# Patient Record
Sex: Female | Born: 1960 | Race: White | Hispanic: No | Smoking: Current every day smoker
Health system: Southern US, Community
[De-identification: ages and names within clinical notes are randomized; demographics above are authoritative.]

## PROBLEM LIST (undated history)

## (undated) DIAGNOSIS — M1990 Unspecified osteoarthritis, unspecified site: Secondary | ICD-10-CM

## (undated) DIAGNOSIS — I10 Essential (primary) hypertension: Secondary | ICD-10-CM

## (undated) DIAGNOSIS — J439 Emphysema, unspecified: Secondary | ICD-10-CM

## (undated) DIAGNOSIS — K219 Gastro-esophageal reflux disease without esophagitis: Secondary | ICD-10-CM

## (undated) HISTORY — PX: OTHER SURGICAL HISTORY: SHX169

## (undated) HISTORY — DX: Essential (primary) hypertension: I10

## (undated) HISTORY — PX: TONSILLECTOMY: SUR1361

## (undated) HISTORY — DX: Gastro-esophageal reflux disease without esophagitis: K21.9

---

## 2017-01-13 ENCOUNTER — Emergency Department (HOSPITAL_BASED_OUTPATIENT_CLINIC_OR_DEPARTMENT_OTHER): Admit: 2017-01-13 | Discharge: 2017-01-13 | Disposition: A | Discharge status: Home / Self Care | Payer: Self-pay

## 2017-01-13 ENCOUNTER — Emergency Department (HOSPITAL_COMMUNITY)
Admission: EM | Admit: 2017-01-13 | Discharge: 2017-01-13 | Disposition: A | Discharge status: Home / Self Care | Payer: Self-pay | Attending: Emergency Medicine | Admitting: Emergency Medicine

## 2017-01-13 ENCOUNTER — Encounter: Payer: Self-pay | Admitting: Pediatric Intensive Care

## 2017-01-13 ENCOUNTER — Encounter (HOSPITAL_COMMUNITY): Payer: Self-pay | Admitting: Emergency Medicine

## 2017-01-13 DIAGNOSIS — M79609 Pain in unspecified limb: Secondary | ICD-10-CM

## 2017-01-13 DIAGNOSIS — L03115 Cellulitis of right lower limb: Secondary | ICD-10-CM | POA: Insufficient documentation

## 2017-01-13 DIAGNOSIS — F17210 Nicotine dependence, cigarettes, uncomplicated: Secondary | ICD-10-CM | POA: Insufficient documentation

## 2017-01-13 DIAGNOSIS — R609 Edema, unspecified: Secondary | ICD-10-CM | POA: Insufficient documentation

## 2017-01-13 HISTORY — DX: Emphysema, unspecified: J43.9

## 2017-01-13 HISTORY — DX: Unspecified osteoarthritis, unspecified site: M19.90

## 2017-01-13 LAB — BASIC METABOLIC PANEL
Anion gap: 7 (ref 5–15)
BUN: 13 mg/dL (ref 6–20)
CHLORIDE: 107 mmol/L (ref 101–111)
CO2: 25 mmol/L (ref 22–32)
Calcium: 8.7 mg/dL — ABNORMAL LOW (ref 8.9–10.3)
Creatinine, Ser: 0.63 mg/dL (ref 0.44–1.00)
GFR calc Af Amer: >60 mL/min (ref >60)
GLUCOSE: 96 mg/dL (ref 65–99)
Potassium: 3.8 mmol/L (ref 3.5–5.1)
SODIUM: 139 mmol/L (ref 135–145)

## 2017-01-13 LAB — CBC WITH DIFFERENTIAL/PLATELET
Basophils Absolute: 0 10*3/uL (ref 0.0–0.1)
Basophils Relative: 0 %
EOS ABS: 0.2 10*3/uL (ref 0.0–0.7)
EOS PCT: 2 %
HCT: 35.2 % — ABNORMAL LOW (ref 36.0–46.0)
Hemoglobin: 11.9 g/dL — ABNORMAL LOW (ref 12.0–15.0)
LYMPHS ABS: 2.4 10*3/uL (ref 0.7–4.0)
Lymphocytes Relative: 26 %
MCH: 29.9 pg (ref 26.0–34.0)
MCHC: 33.8 g/dL (ref 30.0–36.0)
MCV: 88.4 fL (ref 78.0–100.0)
MONOS PCT: 9 %
Monocytes Absolute: 0.8 10*3/uL (ref 0.1–1.0)
NEUTROS ABS: 5.7 10*3/uL (ref 1.7–7.7)
NEUTROS PCT: 63 %
PLATELETS: 317 10*3/uL (ref 150–400)
RBC: 3.98 MIL/uL (ref 3.87–5.11)
RDW: 12.6 % (ref 11.5–15.5)
WBC: 9.2 10*3/uL (ref 4.0–10.5)

## 2017-01-13 LAB — I-STAT CG4 LACTIC ACID, ED: LACTIC ACID, VENOUS: 0.39 mmol/L — AB (ref 0.5–1.9)

## 2017-01-13 MED ORDER — SULFAMETHOXAZOLE-TRIMETHOPRIM 800-160 MG PO TABS
1.0000 | ORAL_TABLET | Freq: Once | ORAL | Status: AC
  Administered 2017-01-13: 1 via ORAL
  Filled 2017-01-13: qty 1

## 2017-01-13 MED ORDER — IBUPROFEN 600 MG PO TABS
600.0000 mg | ORAL_TABLET | Freq: Four times a day (QID) | ORAL | 0 refills | Status: AC | PRN
Start: 2017-01-13 — End: ?

## 2017-01-13 MED ORDER — CEPHALEXIN 500 MG PO CAPS: 500.0000 mg | ORAL_CAPSULE | Freq: Three times a day (TID) | ORAL | 0 refills | Status: AC

## 2017-01-13 MED ORDER — IBUPROFEN 800 MG PO TABS
800.0000 mg | ORAL_TABLET | Freq: Once | ORAL | Status: AC
  Administered 2017-01-13: 800 mg via ORAL
  Filled 2017-01-13: qty 1

## 2017-01-13 MED ORDER — CEPHALEXIN 500 MG PO CAPS
500.0000 mg | ORAL_CAPSULE | Freq: Once | ORAL | Status: AC
  Administered 2017-01-13: 500 mg via ORAL
  Filled 2017-01-13: qty 1

## 2017-01-13 MED ORDER — SULFAMETHOXAZOLE-TRIMETHOPRIM 800-160 MG PO TABS: 1.0000 | ORAL_TABLET | Freq: Two times a day (BID) | ORAL | 0 refills | Status: AC

## 2017-01-13 NOTE — ED Notes (Signed)
ED Provider at bedside. 

## 2017-01-13 NOTE — ED Notes (Signed)
Bed: WA01 Expected date:  Expected time:  Means of arrival:  Comments: 

## 2017-01-13 NOTE — Discharge Instructions (Signed)
Take both keflex and bactrim as prescribed until all gone. Ibuprofen for pain. ELEVATE your legs at rest and at night time. Follow up with family doctor. return if worsening.

## 2017-01-13 NOTE — Progress Notes (Signed)
*  PRELIMINARY RESULTS* Vascular Ultrasound Right lower extremity venous duplex has been completed.  Preliminary findings: No evidence of deep vein thrombosis or baker's cyst in the right lower extremity.  Preliminary results given to patients PA at 14:55   Chauncey FischerCharlotte C Annalisse Minkoff 01/13/2017, 2:59 PM

## 2017-01-13 NOTE — Congregational Nurse Program (Signed)
Congregational Nurse Program Note  Date of Encounter: 01/13/2017  Past Medical History: No past medical history on file.  Encounter Details:     CNP Questionnaire - 01/13/17 1000      Patient Demographics   Is this a new or existing patient? New   Patient is considered a/an Not Applicable   Race Caucasian/White     Patient Assistance   Location of Patient Assistance [facility]   Patient's financial/insurance status Self-Pay (Uninsured);Low Income   Uninsured Patient ([employer], trade [employer]Card/Care Connects) Yes   Interventions Not Applicable;Referred to ED/Urgent Care   Patient referred to apply for the following financial assistance Not Applicable   Food insecurities addressed Not Applicable   Transportation assistance Yes   Type of Assistance Other   Assistance securing medications No   Educational health offerings Acute disease     Encounter Details   Primary purpose of visit Acute Illness/Condition Visit   Was an Emergency Department visit averted? No   Does patient have a medical provider? No   Patient referred to Emergency Department   Was a mental health screening completed? (GAINS tool) No   Does patient have dental issues? No   Does patient have vision issues? No   Does your patient have an abnormal blood pressure today? No   Since previous encounter, have you referred patient for abnormal blood pressure that resulted in a new diagnosis or medication change? No   Does your patient have an abnormal blood glucose today? No   Since previous encounter, have you referred patient for abnormal blood glucose that resulted in a new diagnosis or medication change? No   Was there a life-saving intervention made? No    New client- lobby guest. States that she has red painful swollen right leg. "I may have been bit by something". States that swelling started 3 days ago. She states she has been having "cold sweats". Top of right foot, ankle and just above ankle are edematous, with erythema and  painful to touch. Left ankle WNL. Client agrees to go to [facility] for evaluation.

## 2017-01-13 NOTE — ED Provider Notes (Signed)
[facility]-EMERGENCY DEPT Provider Note   CSN: [id number] Arrival date & time: 01/13/17  1038     History   Chief Complaint Chief Complaint  Patient presents with  . Leg Swelling  . Cellulitis    HPI Geraldine SolarMarcella Cruces is a 56 y.o. female.  HPI Geraldine SolarMarcella Stucke is a 56 y.o. female with history of emphysema, presents to emergency department complaining of bilateral leg swelling with of right leg redness and warmth. Patient states she just got out of jail one week ago, states she has been staying at a shelter and sleeping in a chair since being released from jail. She states she noticed bilateral lower leg swelling since being released, and the last 2 days noticed that her right leg is becoming red, tender, warm to the touch. She denies any injuries. No recent travel or surgeries. She states she has been in the woods might have come in contact with some poison ivy. She denies any fever but reports chills. No history of blood clots in the past. No other complaints.   Past Medical History:  Diagnosis Date  . Arthritis   . Emphysema lung (HCC)     There are no active problems to display for this patient.   Past Surgical History:  Procedure Laterality Date  . cyst removed Right   . rigth fallopian tube removed    . TONSILLECTOMY      OB History    No data available       Home Medications    Prior to Admission medications   Not on File    Family History No family history on file.  Social History Social History  Substance Use Topics  . Smoking status: Current Every Day Smoker    Types: Cigarettes  . Smokeless tobacco: Never Used  . Alcohol use No     Allergies   Penicillins and Levaquin [levofloxacin in d5w]   Review of Systems Review of Systems  Constitutional: Negative for chills and fever.  Respiratory: Negative for cough, chest tightness and shortness of breath.   Cardiovascular: Positive for leg swelling. Negative for chest pain and palpitations.    Gastrointestinal: Negative for abdominal pain, diarrhea, nausea and vomiting.  Musculoskeletal: Positive for arthralgias and joint swelling. Negative for myalgias, neck pain and neck stiffness.  Skin: Negative for rash.  Neurological: Negative for dizziness, weakness and headaches.  All other systems reviewed and are negative.    Physical Exam Updated Vital Signs BP (!) 120/57   Pulse 74   Temp 98.4 F (36.9 C) (Oral)   Resp 18   Ht 5\' 2"  (1.575 m)   Wt 76.2 kg (168 lb)   SpO2 97%   BMI 30.73 kg/m   Physical Exam  Constitutional: She appears well-developed and well-nourished. No distress.  HENT:  Head: Normocephalic.  Eyes: Conjunctivae are normal.  Neck: Neck supple.  Cardiovascular: Normal rate, regular rhythm and normal heart sounds.   Pulmonary/Chest: Effort normal and breath sounds normal. No respiratory distress. She has no wheezes. She has no rales.  Abdominal: Soft. Bowel sounds are normal. She exhibits no distension. There is no tenderness. There is no rebound.  Musculoskeletal: She exhibits edema.  1+ bilateral lower extremity edema. Right leg is erythematous, with erythema mainly extending to the medial ankle and foot, and medial and anterior distal shin. No skin wounds. Dorsal pedal pulses intact and equal bilaterally. Right leg is warm and painful to the touch.  Neurological: She is alert.  Skin: Skin is warm and  dry.  Psychiatric: She has a normal mood and affect. Her behavior is normal.  Nursing note and vitals reviewed.    ED Treatments / Results  Labs (all labs ordered are listed, but only abnormal results are displayed) Labs Reviewed  CBC WITH DIFFERENTIAL/PLATELET  BASIC METABOLIC PANEL  I-STAT CG4 LACTIC ACID, ED    EKG  EKG Interpretation None       Radiology No results found.  Procedures Procedures (including critical care time)  Medications Ordered in ED Medications - No data to display   Initial Impression / Assessment and  Plan / ED Course  I have reviewed the triage vital signs and the nursing notes.  Pertinent labs & imaging results that were available during my care of the patient were reviewed by me and considered in my medical decision making (see chart for details).     Patient with bilateral lower extremity swelling, with right leg redness and warmth to the touch as well as tenderness. Will get a venous duplex, to rule out DVT. Will get basic labs.  3:48 PM Venous Doppler is negative. Labs unremarkable. Normal lactic acid, normal white blood cell count. She is afebrile. Most likely dependent edema, cellulitis of the right lower leg. Will start on Keflex and Bactrim. Follow-up with family doctor as needed. Return precautions discussed. Advised to keep legs elevated as much as possible to reduce the swelling. Compression stockings recommended as well.  Vitals:   01/13/17 1054 01/13/17 1410  BP: (!) 133/57 (!) 120/57  Pulse: 76 74  Resp: 18 18  Temp: 98.4 F (36.9 C)   TempSrc: Oral   SpO2: 98% 97%  Weight: 76.2 kg (168 lb)   Height: 5\' 2"  (1.575 m)       Final Clinical Impressions(s) / ED Diagnoses   Final diagnoses:  Cellulitis of right leg  Dependent edema    New Prescriptions New Prescriptions   CEPHALEXIN (KEFLEX) 500 MG CAPSULE    Take 1 capsule (500 mg total) by mouth 3 (three) times daily.   IBUPROFEN (ADVIL,MOTRIN) 600 MG TABLET    Take 1 tablet (600 mg total) by mouth every 6 (six) hours as needed.   SULFAMETHOXAZOLE-TRIMETHOPRIM (BACTRIM DS,SEPTRA DS) 800-160 MG TABLET    Take 1 tablet by mouth 2 (two) times daily.     Jaynie CrumbleKirichenko, Pierre Cumpton, PA-C 01/13/17 1550    Pricilla LovelessGoldston, Scott, MD 01/13/17 1620

## 2017-01-13 NOTE — ED Triage Notes (Signed)
Patient c/o erythema and swelling of right lower leg and foot x 3 days.  Patient reports that she doesn't take narcotics.

## 2017-01-14 MED FILL — CEPHALEXIN 500 MG CAPSULE: 500 | 7 days supply | Qty: 21 | Fill #0

## 2017-01-14 MED FILL — IBUPROFEN 600 MG TABLET: 600 | 7 days supply | Qty: 30 | Fill #0

## 2017-01-14 MED FILL — SULFAMETHOXAZOLE/TMP DS TAB: 800-160 | 7 days supply | Qty: 14 | Fill #0

## 2017-01-16 ENCOUNTER — Other Ambulatory Visit: Payer: Self-pay | Admitting: Critical Care Medicine

## 2017-01-16 MED ORDER — MOMETASONE FUROATE 220 MCG/INH IN AEPB: 2.0000 | INHALATION_SPRAY | Freq: Every day | RESPIRATORY_TRACT | 1 refills | Status: DC

## 2017-01-16 MED ORDER — RANITIDINE HCL 150 MG PO TABS: 150.0000 mg | ORAL_TABLET | Freq: Two times a day (BID) | ORAL | 1 refills | Status: AC

## 2017-01-16 MED ORDER — ALBUTEROL SULFATE HFA 108 (90 BASE) MCG/ACT IN AERS: 2.0000 | INHALATION_SPRAY | Freq: Four times a day (QID) | RESPIRATORY_TRACT | 1 refills | Status: DC

## 2017-01-16 MED FILL — VENTOLIN HFA 90 MCG INHALER: 108 (90 BAS | 25 days supply | Qty: 18 | Fill #0

## 2017-01-16 MED FILL — ASMANEX TWISTHALER 220 MCG: 220 | 30 days supply | Qty: 2 | Fill #0

## 2017-01-16 MED FILL — raNITIdine HCL 150 MG TABS: 150 | 30 days supply | Qty: 60 | Fill #0 | Status: TO

## 2017-01-16 NOTE — Progress Notes (Signed)
Refills for albuterol, asmanex and ranitidine.  Will be seen in clinic next week Kristy LevansPatrick Kiven Vangilder

## 2017-01-17 ENCOUNTER — Encounter: Payer: Self-pay | Admitting: Pediatric Intensive Care

## 2017-01-20 ENCOUNTER — Encounter: Payer: Self-pay | Admitting: Pediatric Intensive Care

## 2017-01-22 ENCOUNTER — Ambulatory Visit: Payer: Self-pay | Admitting: Critical Care Medicine

## 2017-02-03 ENCOUNTER — Encounter: Payer: Self-pay | Admitting: Pediatric Intensive Care

## 2017-02-05 ENCOUNTER — Other Ambulatory Visit: Payer: Self-pay

## 2017-02-05 ENCOUNTER — Ambulatory Visit: Payer: Self-pay | Attending: Critical Care Medicine | Admitting: Critical Care Medicine

## 2017-02-05 ENCOUNTER — Encounter: Payer: Self-pay | Admitting: Critical Care Medicine

## 2017-02-05 VITALS — BP 145/81 | HR 82 | Temp 98.1°F | Resp 16 | Wt 168.8 lb

## 2017-02-05 DIAGNOSIS — I10 Essential (primary) hypertension: Secondary | ICD-10-CM | POA: Insufficient documentation

## 2017-02-05 DIAGNOSIS — J441 Chronic obstructive pulmonary disease with (acute) exacerbation: Secondary | ICD-10-CM | POA: Insufficient documentation

## 2017-02-05 DIAGNOSIS — E042 Nontoxic multinodular goiter: Secondary | ICD-10-CM | POA: Insufficient documentation

## 2017-02-05 DIAGNOSIS — R0982 Postnasal drip: Secondary | ICD-10-CM | POA: Insufficient documentation

## 2017-02-05 DIAGNOSIS — K219 Gastro-esophageal reflux disease without esophagitis: Secondary | ICD-10-CM | POA: Insufficient documentation

## 2017-02-05 DIAGNOSIS — F17210 Nicotine dependence, cigarettes, uncomplicated: Secondary | ICD-10-CM | POA: Insufficient documentation

## 2017-02-05 DIAGNOSIS — R0789 Other chest pain: Secondary | ICD-10-CM | POA: Insufficient documentation

## 2017-02-05 DIAGNOSIS — M1990 Unspecified osteoarthritis, unspecified site: Secondary | ICD-10-CM | POA: Insufficient documentation

## 2017-02-05 DIAGNOSIS — J432 Centrilobular emphysema: Secondary | ICD-10-CM

## 2017-02-05 MED ORDER — LISINOPRIL 10 MG PO TABS: 10.0000 mg | ORAL_TABLET | Freq: Every day | ORAL | 3 refills | Status: DC

## 2017-02-05 MED ORDER — BUDESONIDE-FORMOTEROL FUMARATE 160-4.5 MCG/ACT IN AERO: 2.0000 | INHALATION_SPRAY | Freq: Two times a day (BID) | RESPIRATORY_TRACT | 12 refills | Status: DC

## 2017-02-05 MED ORDER — PREDNISONE 10 MG PO TABS: ORAL_TABLET | ORAL | 0 refills | Status: DC

## 2017-02-05 MED ORDER — ALBUTEROL SULFATE HFA 108 (90 BASE) MCG/ACT IN AERS: 2.0000 | INHALATION_SPRAY | Freq: Four times a day (QID) | RESPIRATORY_TRACT | 1 refills | Status: DC

## 2017-02-05 MED ORDER — ALBUTEROL SULFATE HFA 108 (90 BASE) MCG/ACT IN AERS: 2.0000 | INHALATION_SPRAY | Freq: Four times a day (QID) | RESPIRATORY_TRACT | 1 refills | Status: AC

## 2017-02-05 MED ORDER — PREDNISONE 10 MG PO TABS: ORAL_TABLET | ORAL | 0 refills | Status: AC

## 2017-02-05 MED ORDER — BUDESONIDE-FORMOTEROL FUMARATE 160-4.5 MCG/ACT IN AERO: 2.0000 | INHALATION_SPRAY | Freq: Two times a day (BID) | RESPIRATORY_TRACT | 12 refills | Status: AC

## 2017-02-05 MED ORDER — LISINOPRIL 10 MG PO TABS: 10.0000 mg | ORAL_TABLET | Freq: Every day | ORAL | 3 refills | Status: AC

## 2017-02-05 MED FILL — ?PREDNISONE 10 MG TABLET: 10 | 8 days supply | Qty: 20 | Fill #0

## 2017-02-05 MED FILL — !VENTOLIN HFA INHALER: 108 (90 BAS | 25 days supply | Qty: 18 | Fill #0

## 2017-02-05 MED FILL — SYMBICORT 160-4.5 MCG INH: 160-4.5 | 30 days supply | Qty: 10 | Fill #0

## 2017-02-05 MED FILL — LISINOPRIL 10 MG TABLET: 10 | 30 days supply | Qty: 30 | Fill #0

## 2017-02-05 NOTE — Assessment & Plan Note (Signed)
HTN   Add Lisinopril 10mg  daily F/u labs Need pcp f/u

## 2017-02-05 NOTE — Progress Notes (Signed)
Subjective:    Patient ID: Kristy Wood, female    DOB: December 31, 1960, 56 y.o.   MRN: [id number]  55 y.o.F just released from Prison with Copd evaluation.  Pt in prison for one year.  Nodule in thyroid.  CT Chest showed emphysema.  Pt out of prison 01/05/17 (one year) .  Thyroid nodules.  Pt notes cough.  Pt actively smoking down to 5 cig per day from 2PPD  (47yrs) .  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 The current episode started more than 1 year ago (24yrs). The problem occurs daily (exertional only, up steps, walking). The problem has been gradually worsening (worse with humidity). Associated symptoms include chest pain, headaches, leg swelling, orthopnea, rhinorrhea, a sore throat, sputum production, swollen glands and wheezing. Pertinent negatives include no fever, hemoptysis, leg pain or PND. The symptoms are aggravated by smoke, weather changes, fumes, any activity, lying flat, exercise, odors, pollens and eating. Associated symptoms comments: Cough is productive of mucus Chest tightness, notes post nasal drip. Risk factors include smoking. She has tried beta agonist inhalers, oral steroids and steroid inhalers for the symptoms. The treatment provided moderate relief. Her past medical history is significant for COPD and pneumonia. There is no history of asthma.    Past Medical History:  Diagnosis Date  . Arthritis   . Emphysema lung (HCC)   . GERD (gastroesophageal reflux disease)   . Hypertension      No family history on file.   Social History   Social History  . Marital status: Divorced    Spouse name: N/A  . Number of children: N/A  . Years of education: N/A   Occupational History  . Not on file.   Social History Main Topics  . Smoking status: Current Every Day Smoker    Types: Cigarettes  . Smokeless tobacco: Never Used  . Alcohol use No  . Drug use: Unknown  . Sexual activity: Not on file   Other Topics Concern    . Not on file   Social History Narrative  . No narrative on file     Allergies  Allergen Reactions  . Penicillins Anaphylaxis    Has patient had a PCN reaction causing immediate rash, facial/tongue/throat swelling, SOB or lightheadedness with hypotension:  yes Has patient had a PCN reaction causing severe rash involving mucus membranes or skin necrosis: no Has patient had a PCN reaction that required hospitalization: yes Has patient had a PCN reaction occurring within the last 10 years: no If all of the above answers are "NO", then may proceed with Cephalosporin use.   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 ibuprofen (ADVIL,MOTRIN) 600 MG tablet Take 1 tablet (600 mg total) by mouth every 6 (six) hours as needed. 30 tablet 0  . ranitidine (ZANTAC) 150 MG tablet Take 1 tablet (150 mg total) by mouth 2 (two) times daily. 60 tablet 1  . albuterol (PROVENTIL HFA;VENTOLIN HFA) 108 (90 Base) MCG/ACT inhaler Inhale 2 puffs into the lungs every 6 (six) hours. 1 Inhaler 1  . mometasone (ASMANEX) 220 MCG/INH inhaler Inhale 2 puffs into the lungs daily. 1 Inhaler 1  . cephALEXin (KEFLEX) 500 MG capsule Take 1 capsule (500 mg total) by mouth 3 (three) times daily. (Patient not taking: Reported on 02/05/2017) 21 capsule 0   No facility-administered  medications prior to visit.      Review of Systems  Constitutional: Negative for fever.  HENT: Positive for rhinorrhea and sore throat.   Respiratory: Positive for sputum production, shortness of breath and wheezing. Negative for hemoptysis.   Cardiovascular: Positive for chest pain, orthopnea and leg swelling. Negative for PND.  Neurological: Positive for headaches.       Objective:   Physical Exam Vitals:   02/05/17 0854  BP: (!) 145/81  Pulse: 82  Resp: 16  Temp: 98.1 F (36.7 C)  TempSrc: Oral  SpO2: 95%  Weight: 168 lb 12.8 oz (76.6 kg)     Gen: Pleasant, well-nourished, in no distress,  normal affect  ENT: No lesions,  mouth clear,  oropharynx clear, no postnasal drip  Neck: No JVD, no TMG, no carotid bruits  Lungs: No use of accessory muscles, no dullness to percussion, distant BS, exp wheezes  Cardiovascular: RRR, heart sounds normal, no murmur or gallops, no peripheral edema  Abdomen: soft and NT, no HSM,  BS normal  Musculoskeletal: No deformities, no cyanosis or clubbing  Neuro: alert, non focal  Skin: Warm, no lesions or rashes  No results found.   Need to review records from [facility]     Assessment & Plan:  I personally reviewed all images and lab data in the [facility] system as well as any outside material available during this office visit and agree with the  radiology impressions.   COPD exacerbation (HCC) COPD with exacerbation , weather a factor Active smoking Plan  d/c asmanex Start symbicort two puff bid Cont albuterol prn pred pulse and taper No ABX Smoking cessation PFTs Need outside records  Essential hypertension HTN   Add Lisinopril 10mg  daily F/u labs Need pcp f/u   Kristy Wood was seen today for copd.  Diagnoses and all orders for this visit:  COPD exacerbation (HCC) -     Pulmonary Function Test; Future  Centrilobular emphysema (HCC)  Essential hypertension  Multiple thyroid nodules  Smoking 1/2 pack a day or less  Other orders -     budesonide-formoterol (SYMBICORT) 160-4.5 MCG/ACT inhaler; Inhale 2 puffs into the lungs 2 (two) times daily. -     predniSONE (DELTASONE) 10 MG tablet; Take 4 for two days three for two days two for two days one for two days -     lisinopril (PRINIVIL,ZESTRIL) 10 MG tablet; Take 1 tablet (10 mg total) by mouth daily.

## 2017-02-05 NOTE — Assessment & Plan Note (Addendum)
COPD with exacerbation , weather a factor Active smoking Plan  d/c asmanex Start symbicort two puff bid Cont albuterol prn pred pulse and taper No ABX Smoking cessation PFTs Need outside records

## 2017-02-05 NOTE — Assessment & Plan Note (Signed)
Ongoing tobacco use Smoking cessatoin given

## 2017-02-05 NOTE — Patient Instructions (Signed)
Start Lisinopril one daily Stop Asmanex  Start Symbicort two puff twice daily Take prednisone 10mg  : Take 4 for two days three for two days two for two days one for two days Use albuterol as needed A lung function test will be obtained We will review old records Return in October, 17th

## 2017-02-07 ENCOUNTER — Encounter: Payer: Self-pay | Admitting: Pediatric Intensive Care

## 2017-02-10 ENCOUNTER — Encounter: Payer: Self-pay | Admitting: Pediatric Intensive Care

## 2017-02-11 NOTE — Congregational Nurse Program (Signed)
Congregational Nurse Program Note  Date of Encounter: 01/17/2017  Past Medical History: Past Medical History:  Diagnosis Date  . Arthritis   . Emphysema lung (HCC)   . GERD (gastroesophageal reflux disease)   . Hypertension     Encounter Details:     CNP Questionnaire - 01/17/17 0900      Patient Demographics   Is this a new or existing patient? Existing   Patient is considered a/an Not Applicable   Race Caucasian/White     Patient Assistance   Location of Patient Assistance [facility]   Patient's financial/insurance status Self-Pay (Uninsured);Low Income   Uninsured Patient ([employer] Card/Care Connects) Yes   Interventions Follow-up/Education/Support provided after completed appt.   Patient referred to apply for the following financial assistance Not Applicable   Food insecurities addressed Not Applicable   Transportation assistance No   Assistance securing medications Yes   Type of Assistance [facility]   Educational health offerings Acute disease     Encounter Details   Primary purpose of visit Acute Illness/Condition Visit;Post ED/Hospitalization Visit   Was an Emergency Department visit averted? Not Applicable   Does patient have a medical provider? No   Patient referred to Establish PCP   Was a mental health screening completed? (GAINS tool) No   Does patient have dental issues? No   Does patient have vision issues? No   Does your patient have an abnormal blood pressure today? No   Since previous encounter, have you referred patient for abnormal blood pressure that resulted in a new diagnosis or medication change? No   Does your patient have an abnormal blood glucose today? No   Since previous encounter, have you referred patient for abnormal blood glucose that resulted in a new diagnosis or medication change? No   Was there a life-saving intervention made? No         Clinical Intake - 02/05/17 0857      Pain   Pain  0-10   Pain Score 7    Pain Type Acute  pain   Pain Location Head     Nutrition Screen   Diabetes No     Functional Status   Activities of Daily Living Independent   Ambulation Independent   Medication Administration Independent   Home Management Independent     Risk/Barriers   Barriers to Care Management & Learning None   Psychosocial Barriers Financial need     Abuse/Neglect   Do you feel unsafe in your current relationship? No   Do you feel physically threatened by others? No   Anyone hurting you at home, work, or school? No   Unable to ask? No    Post ED visit- medications via [facility]. Right ankle is still swollen but client states is less painful. Client states long history of 2ppd smoking but she is down to about 5 cigarettes per day. Client will need PCP link and follow up for COPD/inhaler therapy.

## 2017-02-19 NOTE — Congregational Nurse Program (Signed)
Congregational Nurse Program Note  Date of Encounter: 01/17/2017  Past Medical History: Past Medical History:  Diagnosis Date  . Arthritis   . Emphysema lung (HCC)   . GERD (gastroesophageal reflux disease)   . Hypertension     Encounter Details:      Clinical Intake - 02/05/17 0857      Pain   Pain  0-10   Pain Score 7    Pain Type Acute pain   Pain Location Head     Nutrition Screen   Diabetes No     Functional Status   Activities of Daily Living Independent   Ambulation Independent   Medication Administration Independent   Home Management Independent     Risk/Barriers   Barriers to Care Management & Learning None   Psychosocial Barriers Financial need     Abuse/Neglect   Do you feel unsafe in your current relationship? No   Do you feel physically threatened by others? No   Anyone hurting you at home, work, or school? No   Unable to ask? No    Advised client she has upcoming pulmonology appointment. CN will pick up inhalers.

## 2017-02-26 NOTE — Congregational Nurse Program (Signed)
Congregational Nurse Program Note  Date of Encounter: 01/20/2017  Past Medical History: Past Medical History:  Diagnosis Date  . Arthritis   . Emphysema lung (HCC)   . GERD (gastroesophageal reflux disease)   . Hypertension     Encounter Details:      Clinical Intake - 02/05/17 0857      Pain   Pain  0-10   Pain Score 7    Pain Type Acute pain   Pain Location Head     Nutrition Screen   Diabetes No     Functional Status   Activities of Daily Living Independent   Ambulation Independent   Medication Administration Independent   Home Management Independent     Risk/Barriers   Barriers to Care Management & Learning None   Psychosocial Barriers Financial need     Abuse/Neglect   Do you feel unsafe in your current relationship? No   Do you feel physically threatened by others? No   Anyone hurting you at home, work, or school? No   Unable to ask? No     Leg swelling improved. Client wearing compression socks. Client has appointment with Dr Delford FieldWright in pulmonary clinic.

## 2017-03-02 NOTE — Congregational Nurse Program (Signed)
Congregational Nurse Program Note  Date of Encounter: 02/03/2017  Past Medical History: Past Medical History:  Diagnosis Date  . Arthritis   . Emphysema lung (HCC)   . GERD (gastroesophageal reflux disease)   . Hypertension     Encounter Details:     CNP Questionnaire - 02/03/17 1015      Patient Demographics   Is this a new or existing patient? Existing   Patient is considered a/an Not Applicable   Race Caucasian/White     Patient Assistance   Location of Patient Assistance [facility]   Patient's financial/insurance status Self-Pay (Uninsured);Low Income   Uninsured Patient ([employer] Card/Care Connects) Yes   Interventions Not Applicable   Patient referred to apply for the following financial assistance [facility]   Food insecurities addressed Not Applicable   Transportation assistance Yes   Type of Assistance Bus Pass Given   Assistance securing medications No   Type of Assistance Other   Educational health offerings Navigating the healthcare system     Encounter Details   Primary purpose of visit Education/Health Concerns;Chronic Illness/Condition Visit   Was an Emergency Department visit averted? Not Applicable   Does patient have a medical provider? No   Patient referred to Establish PCP   Was a mental health screening completed? (GAINS tool) No   Does patient have dental issues? No   Does patient have vision issues? No   Does your patient have an abnormal blood pressure today? No   Since previous encounter, have you referred patient for abnormal blood pressure that resulted in a new diagnosis or medication change? No   Does your patient have an abnormal blood glucose today? No   Since previous encounter, have you referred patient for abnormal blood glucose that resulted in a new diagnosis or medication change? No   Was there a life-saving intervention made? No         Clinical Intake - 02/05/17 0857      Pain   Pain  0-10   Pain Score 7    Pain Type Acute pain   Pain Location Head     Nutrition Screen   Diabetes No     Functional Status   Activities of Daily Living Independent   Ambulation Independent   Medication Administration Independent   Home Management Independent     Risk/Barriers   Barriers to Care Management & Learning None   Psychosocial Barriers Financial need     Abuse/Neglect   Do you feel unsafe in your current relationship? No   Do you feel physically threatened by others? No   Anyone hurting you at home, work, or school? No   Unable to ask? No     Upcoming pulmonology appointment. Bus passes given.

## 2017-03-02 NOTE — Congregational Nurse Program (Signed)
Congregational Nurse Program Note  Date of Encounter: 02/07/2017  Past Medical History: Past Medical History:  Diagnosis Date  . Arthritis   . Emphysema lung (HCC)   . GERD (gastroesophageal reflux disease)   . Hypertension     Encounter Details:     CNP Questionnaire - 02/07/17 1000      Patient Demographics   Is this a new or existing patient? Existing   Patient is considered a/an Not Applicable   Race Caucasian/White     Patient Assistance   Location of Patient Assistance [facility]   Patient's financial/insurance status Self-Pay (Uninsured);Low Income   Uninsured Patient ([employer]) Yes   Interventions Appt. has been completed;Follow-up/Education/Support provided after completed appt.   Patient referred to apply for the following financial assistance [facility];[employer]   Food insecurities addressed Not Applicable   Transportation assistance No   Type of Assistance Other   Assistance securing medications No   Type of Assistance Other   Educational health offerings Chronic disease;Medications     Encounter Details   Primary purpose of visit Chronic Illness/Condition Visit;Post PCP Visit   Was an Emergency Department visit averted? Not Applicable   Does patient have a medical provider? Yes   Patient referred to Follow up with established PCP   Was a mental health screening completed? (GAINS tool) No   Does patient have dental issues? No   Does patient have vision issues? No   Does your patient have an abnormal blood pressure today? No   Since previous encounter, have you referred patient for abnormal blood pressure that resulted in a new diagnosis or medication change? No   Does your patient have an abnormal blood glucose today? No   Since previous encounter, have you referred patient for abnormal blood glucose that resulted in a new diagnosis or medication change? No   Was there a life-saving intervention made? No          Clinical Intake - 02/05/17 0857      Pain   Pain  0-10   Pain Score 7    Pain Type Acute pain   Pain Location Head     Nutrition Screen   Diabetes No     Functional Status   Activities of Daily Living Independent   Ambulation Independent   Medication Administration Independent   Home Management Independent     Risk/Barriers   Barriers to Care Management & Learning None   Psychosocial Barriers Financial need     Abuse/Neglect   Do you feel unsafe in your current relationship? No   Do you feel physically threatened by others? No   Anyone hurting you at home, work, or school? No   Unable to ask? No    Pulmonology appointment completed. Client started on lisinopril. Has upcoming PCP appointment and pulmonology follow up. Client can pick up medications at [facility] pharmacy.

## 2017-03-03 ENCOUNTER — Encounter (HOSPITAL_COMMUNITY): Payer: Self-pay | Admitting: Emergency Medicine

## 2017-03-03 ENCOUNTER — Encounter: Payer: Self-pay | Admitting: Pediatric Intensive Care

## 2017-03-03 ENCOUNTER — Emergency Department (HOSPITAL_COMMUNITY): Payer: Self-pay

## 2017-03-03 ENCOUNTER — Emergency Department (HOSPITAL_COMMUNITY)
Admission: EM | Admit: 2017-03-03 | Discharge: 2017-03-03 | Disposition: A | Discharge status: Home / Self Care | Payer: Self-pay | Attending: Emergency Medicine | Admitting: Emergency Medicine

## 2017-03-03 DIAGNOSIS — R05 Cough: Secondary | ICD-10-CM | POA: Insufficient documentation

## 2017-03-03 DIAGNOSIS — M25562 Pain in left knee: Secondary | ICD-10-CM | POA: Insufficient documentation

## 2017-03-03 DIAGNOSIS — Z5321 Procedure and treatment not carried out due to patient leaving prior to being seen by health care provider: Secondary | ICD-10-CM | POA: Insufficient documentation

## 2017-03-03 NOTE — ED Triage Notes (Signed)
Per pt, states cough, congestion for 4 days-states coughing up phlegm-states she twisted left knee this am while coming down stairs

## 2017-03-03 NOTE — Congregational Nurse Program (Signed)
Congregational Nurse Program Note  Date of Encounter: 02/10/2017  Past Medical History: Past Medical History:  Diagnosis Date  . Arthritis   . Emphysema lung (HCC)   . GERD (gastroesophageal reflux disease)   . Hypertension     Encounter Details:     CNP Questionnaire - 03/03/17 1000      Patient Demographics   Is this a new or existing patient? 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 No   Does patient have a medical provider? Yes   Patient referred to Emergency Department   Was a mental health screening completed? (GAINS tool) No   Does patient have dental issues? No   Does patient have vision issues? No   Does your patient have an abnormal blood pressure today? Yes   Since previous encounter, have you referred patient for abnormal blood pressure that resulted in a new diagnosis or medication change? No   Does your patient have an abnormal blood glucose today? No   Since previous encounter, have you referred patient for abnormal blood glucose that resulted in a new diagnosis or medication change? No   Was there a life-saving intervention made? No         Clinical Intake - 02/05/17 0857      Pain   Pain  0-10   Pain Score 7    Pain Type Acute pain   Pain Location Head      Nutrition Screen   Diabetes No     Functional Status   Activities of Daily Living Independent   Ambulation Independent   Medication Administration Independent   Home Management Independent     Risk/Barriers   Barriers to Care Management & Learning None   Psychosocial Barriers Financial need     Abuse/Neglect   Do you feel unsafe in your current relationship? No   Do you feel physically threatened by others? No   Anyone hurting you at home, work, or school? No   Unable to ask? No    BP check. Client has filled out C[employer] and [employer] as well as Medicaid. Needs bus passes to get to [facility].

## 2017-03-03 NOTE — Congregational Nurse Program (Signed)
Congregational Nurse Program Note  Date of Encounter: 03/03/2017  Past Medical History: Past Medical History:  Diagnosis Date  . Arthritis   . Emphysema lung (HCC)   . GERD (gastroesophageal reflux disease)   . Hypertension     Encounter Details: Client states she has had URI symptoms for 4 days. Symptoms are not improving and she developed a productive cough with yellow sputum. States she thinks she's had a fever. BBS- decreased throughout.  Client has an intermittent  cough. CN suggests evaluation in emergency room given clients COPD history. Taxi voucher given. Bus pass for return. Client will bring any prescriptions to CN clinic.

## 2017-03-03 NOTE — ED Provider Notes (Cosign Needed)
Patient left before being seen.    Kristy Wood, [place] 03/03/17 1631

## 2017-03-03 NOTE — ED Notes (Signed)
PA went to see pt multiple times, but pt not in room. Pt not in room again. Assumed pt eloped.

## 2017-03-04 ENCOUNTER — Encounter: Payer: Self-pay | Admitting: Pediatric Intensive Care

## 2017-03-04 NOTE — Congregational Nurse Program (Signed)
Congregational Nurse Program Note  Date of Encounter: 03/04/2017  Past Medical History: Past Medical History:  Diagnosis Date  . Arthritis   . Emphysema lung (HCC)   . GERD (gastroesophageal reflux disease)   . Hypertension     Encounter Details:     CNP Questionnaire - 03/04/17 1015      Patient Demographics   Is this a new or existing patient? Existing   Patient is considered a/an Not Applicable   Race Caucasian/White     Patient Assistance   Location of Patient Assistance [facility]   Patient's financial/insurance status Self-Pay (Uninsured);Low Income   Uninsured Patient ([employer] Card[employer]s) Yes   Interventions Referred to ED/Urgent Care   Patient referred to apply for the following financial assistance Not Applicable   Food insecurities addressed Not Applicable   Transportation assistance Yes   Type of Assistance Other   Assistance securing medications No   Educational health offerings Not Applicable     Encounter Details   Primary purpose of visit Acute Illness/Condition Visit;Chronic Illness/Condition Visit   Was an Emergency Department visit averted? Not Applicable   Does patient have a medical provider? Yes   Patient referred to Follow up with established PCP   Was a mental health screening completed? (GAINS tool) No   Does patient have dental issues? No   Does patient have vision issues? No   Does your patient have an abnormal blood pressure today? Yes   Since previous encounter, have you referred patient for abnormal blood pressure that resulted in a new diagnosis or medication change? No   Does your patient have an abnormal blood glucose today? No   Since previous encounter, have you referred patient for abnormal blood glucose that resulted in a new diagnosis or medication change? No   Was there a life-saving intervention made? No     Follow up after ED appointment. Client went to ED yesterday, had chest and knee films but left without seeing medical  provider. Client states that she "was in the ED for 5 hours" and didn't like the way she was treated. BBS- clear with somewhat improved air movement. Client has productive cough with yellow sputum and nasal drainage. Cough is persistent and lept client awake last night. CN will attempt contact medical director regarding acute illness. CN advised return to ED for evaluation.

## 2017-03-07 ENCOUNTER — Encounter: Payer: Self-pay | Admitting: Pediatric Intensive Care

## 2017-03-07 MED FILL — LISINOPRIL 10 MG TABS: 10 | 30 days supply | Qty: 30 | Fill #1

## 2017-03-10 ENCOUNTER — Encounter: Payer: Self-pay | Admitting: Pediatric Intensive Care

## 2017-03-10 NOTE — Congregational Nurse Program (Signed)
Congregational Nurse Program Note  Date of Encounter: 03/07/2017  Past Medical History: Past Medical History:  Diagnosis Date  . Arthritis   . Emphysema lung (HCC)   . GERD (gastroesophageal reflux disease)   . Hypertension     Encounter Details:     CNP Questionnaire - 03/07/17 1000      Patient Demographics   Is this a new or existing patient? 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 Not Applicable   Does patient have a medical provider? Yes   Patient referred to Follow up with established PCP   Was a mental health screening completed? (GAINS tool) No   Does patient have dental issues? No   Does patient have vision issues? No   Does your patient have an abnormal blood pressure today? No   Since previous encounter, have you referred patient for abnormal blood pressure that resulted in a new diagnosis or medication change? No   Does your patient have an abnormal blood glucose today? No   Since previous encounter, have you referred patient for abnormal blood glucose that resulted in a new diagnosis or medication change? No   Was there a life-saving intervention made? No    BP check. Client states that she will run out of BP medication on  Monday. BBS- improved air movement. CTA- right base decreased. CN will assist with medications.

## 2017-03-10 NOTE — Congregational Nurse Program (Signed)
Congregational Nurse Program Note  Date of Encounter: 03/10/2017  Past Medical History: Past Medical History:  Diagnosis Date  . Arthritis   . Emphysema lung (HCC)   . GERD (gastroesophageal reflux disease)   . Hypertension     Encounter Details:     CNP Questionnaire - 03/10/17 1015      Patient Demographics   Is this a new or existing patient? 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 Not Applicable   Patient referred to Follow up with established PCP   Was a mental health screening completed? (GAINS tool) No   Does patient have dental issues? No   Does patient have vision issues? No   Does your patient have an abnormal blood pressure today? Yes   Since previous encounter, have you referred patient for abnormal blood pressure that resulted in a new diagnosis or medication change? No   Does your patient have an abnormal blood glucose today? No   Since previous encounter, have you referred patient for abnormal blood glucose that resulted in a new diagnosis or medication change? No   Was there a life-saving intervention made? No    Inquiring regarding medication refills. CN contacted [facility] pharmacy on Friday. Refills will be ready for pickup tomorrow. BBS- improving. No wheezing. Cough improved. RTC  tomorrow.

## 2017-03-11 MED FILL — raNITIdine HCL 150 MG TABS: 150 | 30 days supply | Qty: 60 | Fill #0

## 2017-03-17 ENCOUNTER — Ambulatory Visit: Payer: Self-pay | Admitting: Family Medicine

## 2017-03-20 NOTE — Congregational Nurse Program (Signed)
Congregational Nurse Program Note  Date of Encounter: 02/24/2017  Past Medical History: Past Medical History:  Diagnosis Date  . Arthritis   . Emphysema lung (HCC)   . GERD (gastroesophageal reflux disease)   . Hypertension     Encounter Details:     CNP Questionnaire - 03/10/17 1015      Patient Demographics   Is this a new or existing patient? 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 Not Applicable   Does patient have a medical provider? Yes   Patient referred to Follow up with established PCP   Was a mental health screening completed? (GAINS tool) No   Does patient have dental issues? No   Does patient have vision issues? No   Does your patient have an abnormal blood pressure today? Yes   Since previous encounter, have you referred patient for abnormal blood pressure that resulted in a new diagnosis or medication change? No   Does your patient have an abnormal blood glucose today? No   Since previous encounter, have you referred patient for abnormal blood glucose that resulted in a new diagnosis or medication change? No   Was there a life-saving intervention made? No     Bus passes provided to obtain medications from [facility].  C/O cough.  Provided information  about OTC cough medication and B/P medication.

## 2017-03-23 NOTE — Congregational Nurse Program (Signed)
Congregational Nurse Program Note  Date of Encounter: 03/04/2017  Past Medical History: Past Medical History:  Diagnosis Date  . Arthritis   . Emphysema lung (HCC)   . GERD (gastroesophageal reflux disease)   . Hypertension     Encounter Details:     CNP Questionnaire - 03/10/17 1015      Patient Demographics   Is this a new or existing patient? 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 Not Applicable   Does patient have a medical provider? Yes   Patient referred to Follow up with established PCP   Was a mental health screening completed? (GAINS tool) No   Does patient have dental issues? No   Does patient have vision issues? No   Does your patient have an abnormal blood pressure today? Yes   Since previous encounter, have you referred patient for abnormal blood pressure that resulted in a new diagnosis or medication change? No   Does your patient have an abnormal blood glucose today? No   Since previous encounter, have you referred patient for abnormal blood glucose that resulted in a new diagnosis or medication change? No   Was there a life-saving intervention made? No     Client states she has filled out CFA application and has appointent. Continued URI  symptoms. BBS- no wheezing noted. Fair air movement except right base. Client states she is running low on medication and will run out before her next PCP appointment. CN will call [facility] pharmacy to verify any refills.

## 2017-04-02 ENCOUNTER — Ambulatory Visit: Payer: Self-pay | Admitting: Critical Care Medicine

## 2018-04-11 IMAGING — CR DG KNEE COMPLETE 4+V*L*
4 series · 4 of 4 positions shown · non-contrast
Comparison: None.

CLINICAL DATA: Pain following twisting injury

EXAM:
LEFT KNEE - COMPLETE 4+ VIEW

[t knee ap left]
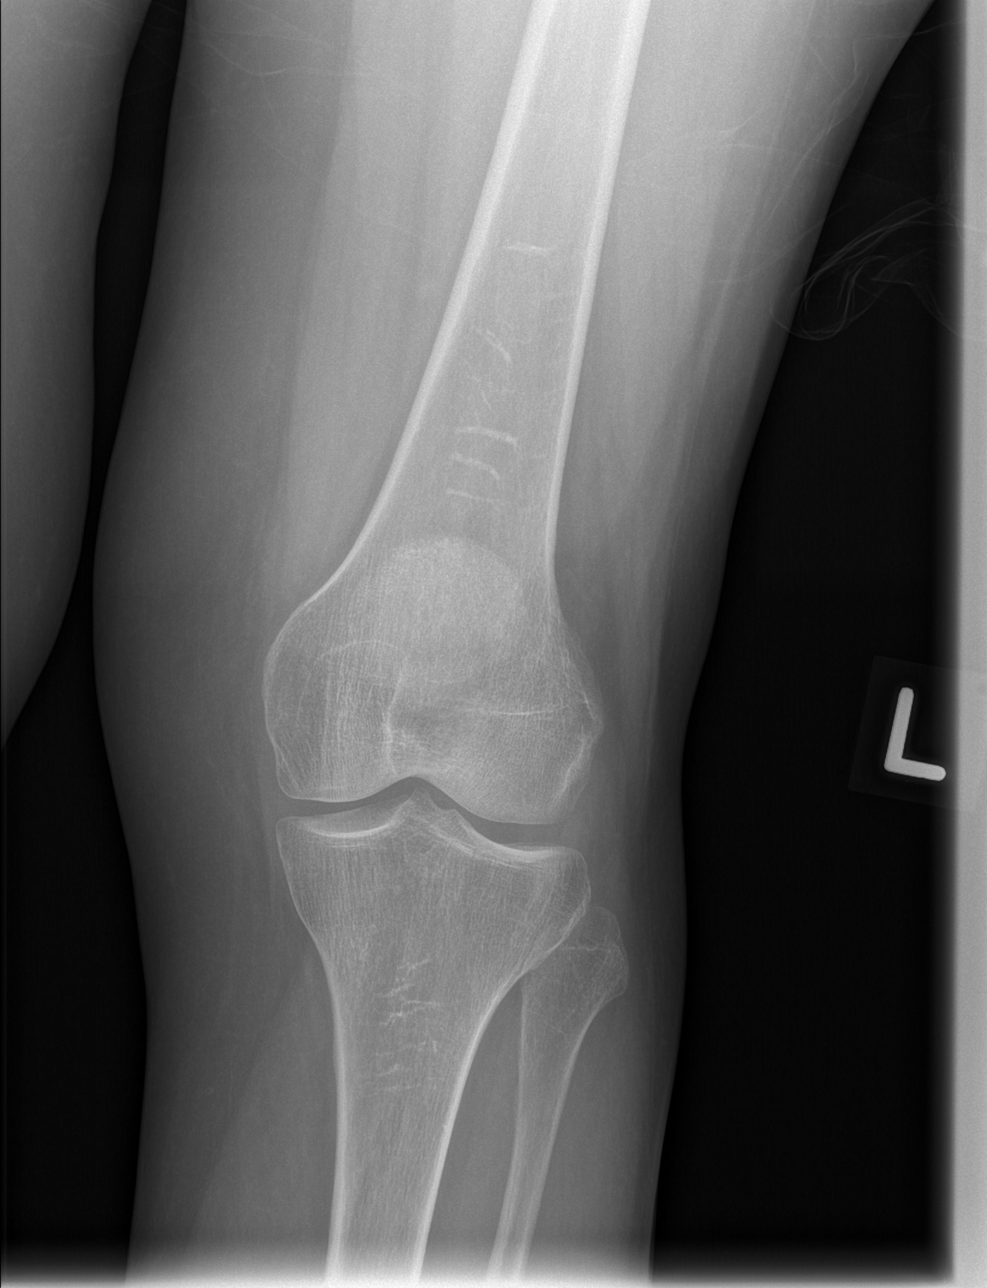

[t knee obl left (1 of 2)]
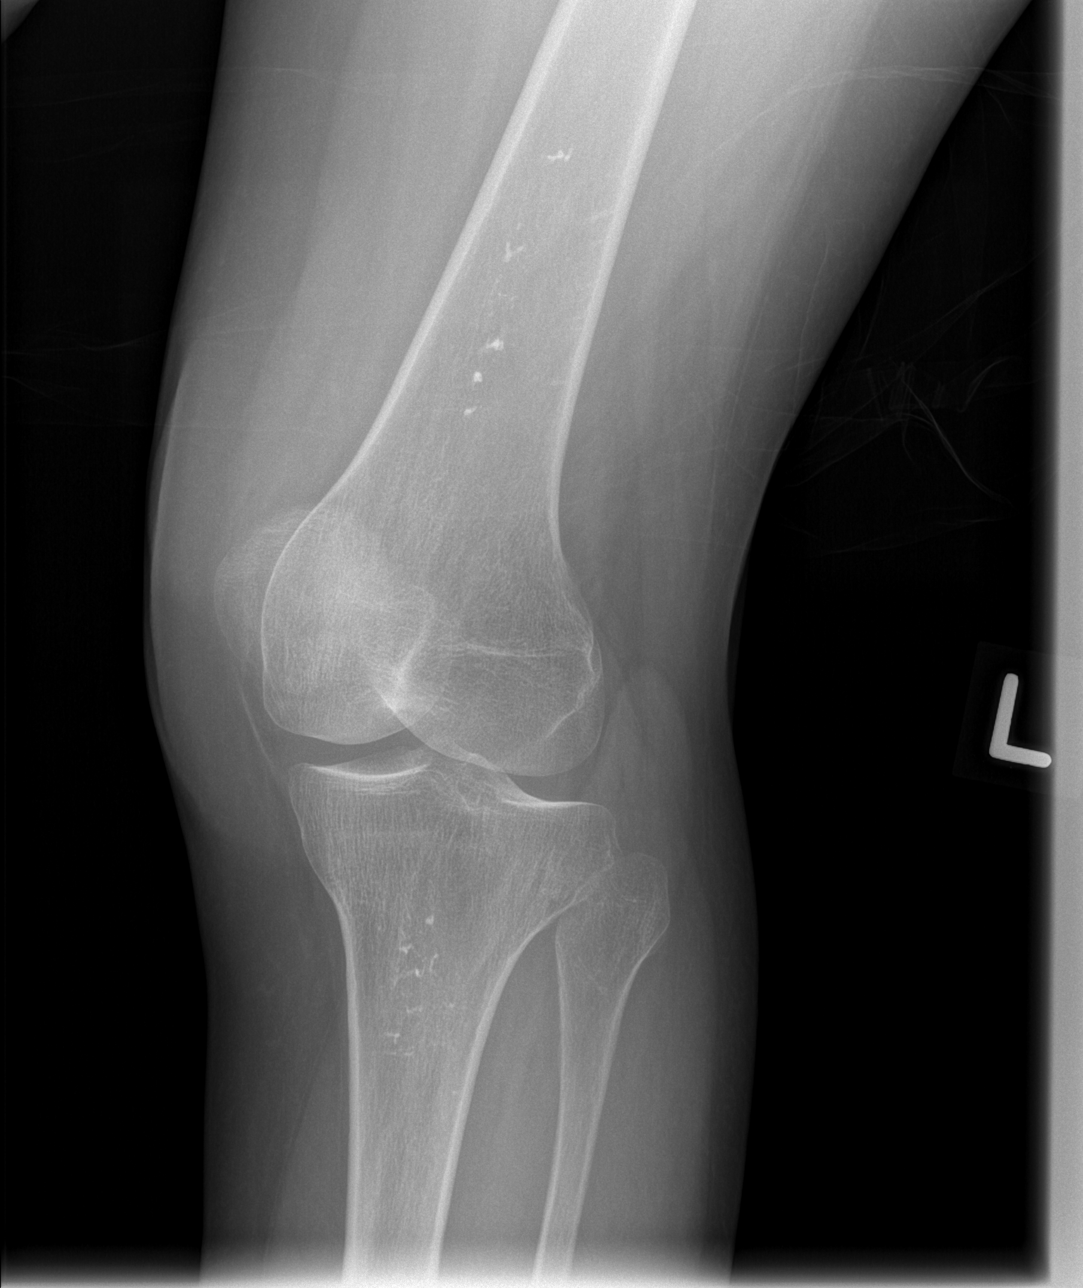

[t knee obl left (2 of 2)]
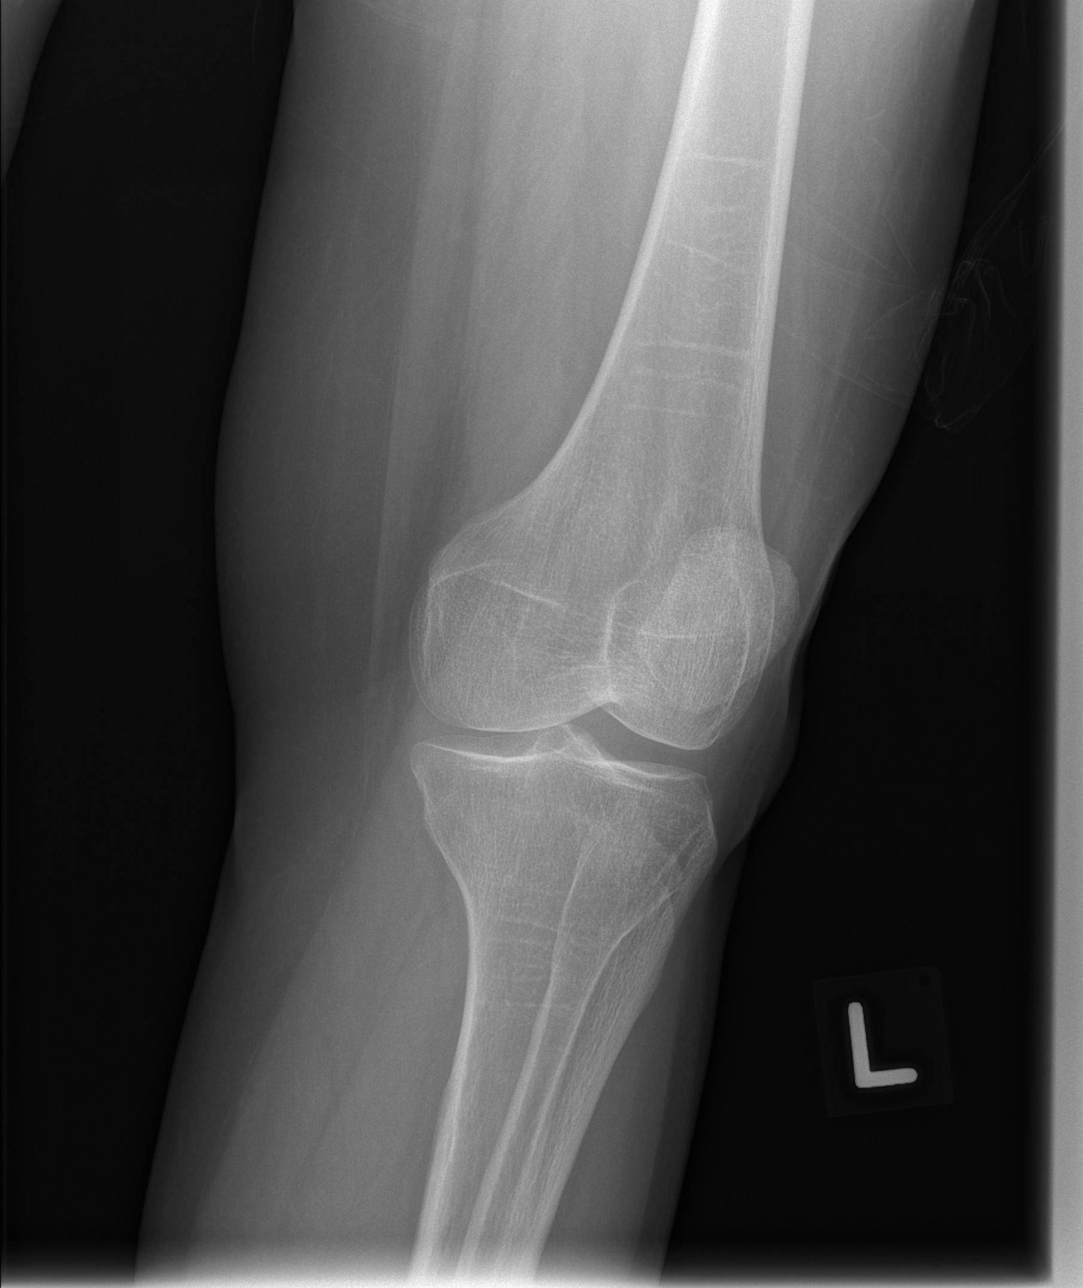

[t knee lat left]
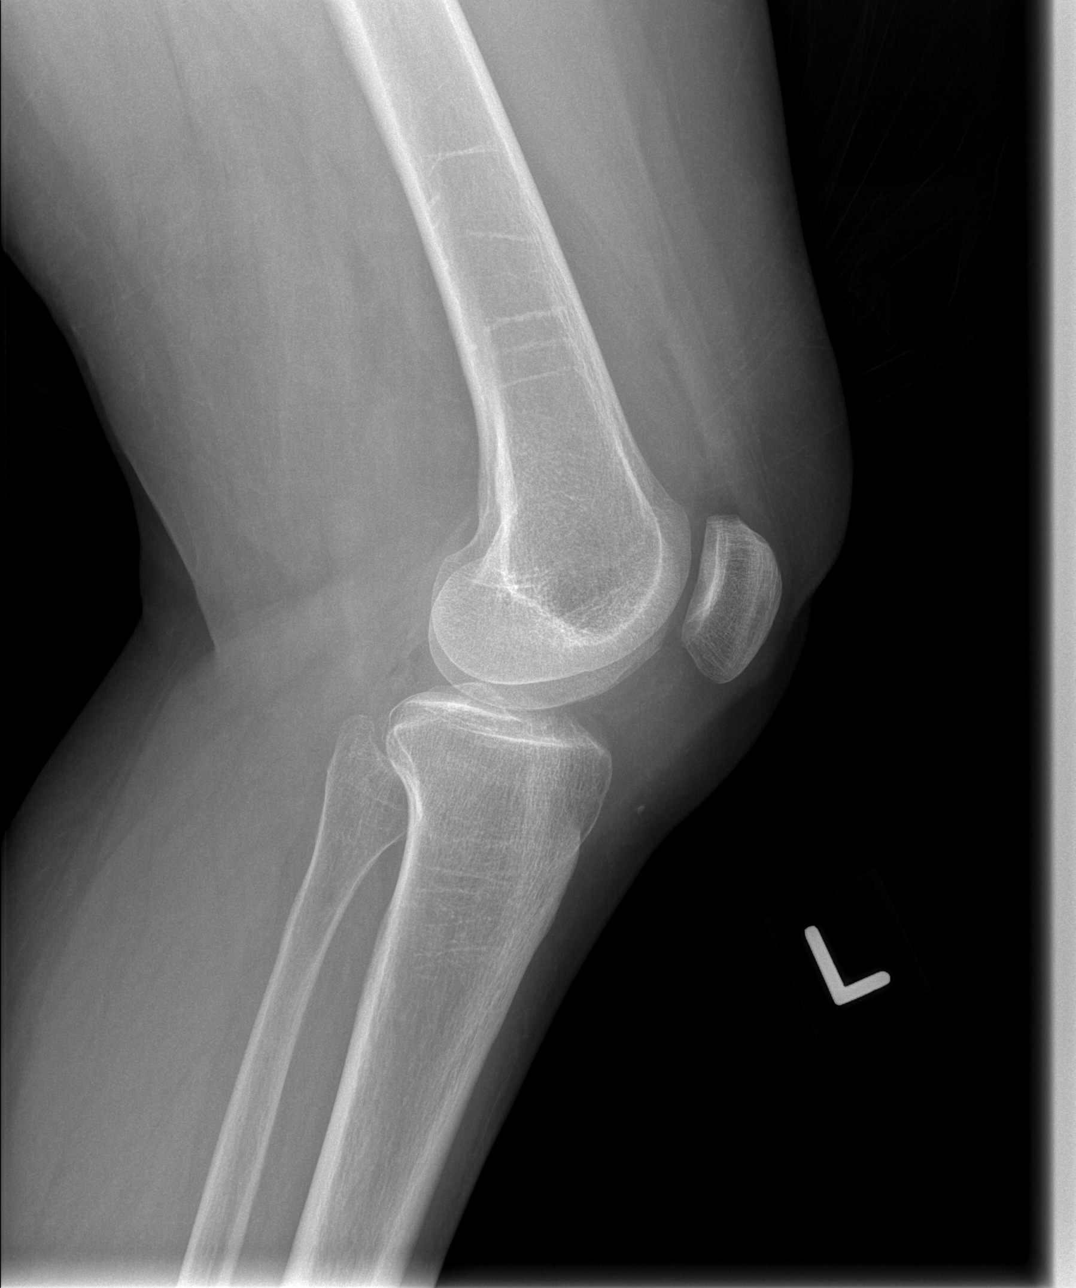

[4 of 4 positions shown; findings below may reference images not displayed]

FINDINGS: Frontal, lateral, and bilateral oblique views were obtained. There
is no fracture or dislocation. No joint effusion. Joint spaces
appear normal. No erosive change.
IMPRESSION: No demonstrable fracture or joint effusion. No appreciable
arthropathy.

## 2018-04-11 IMAGING — CR DG CHEST 2V
2 series · 2 of 2 positions shown · non-contrast
Comparison: None.

CLINICAL DATA: Cough and congestion

EXAM:
CHEST  2 VIEW

[w chest pa]
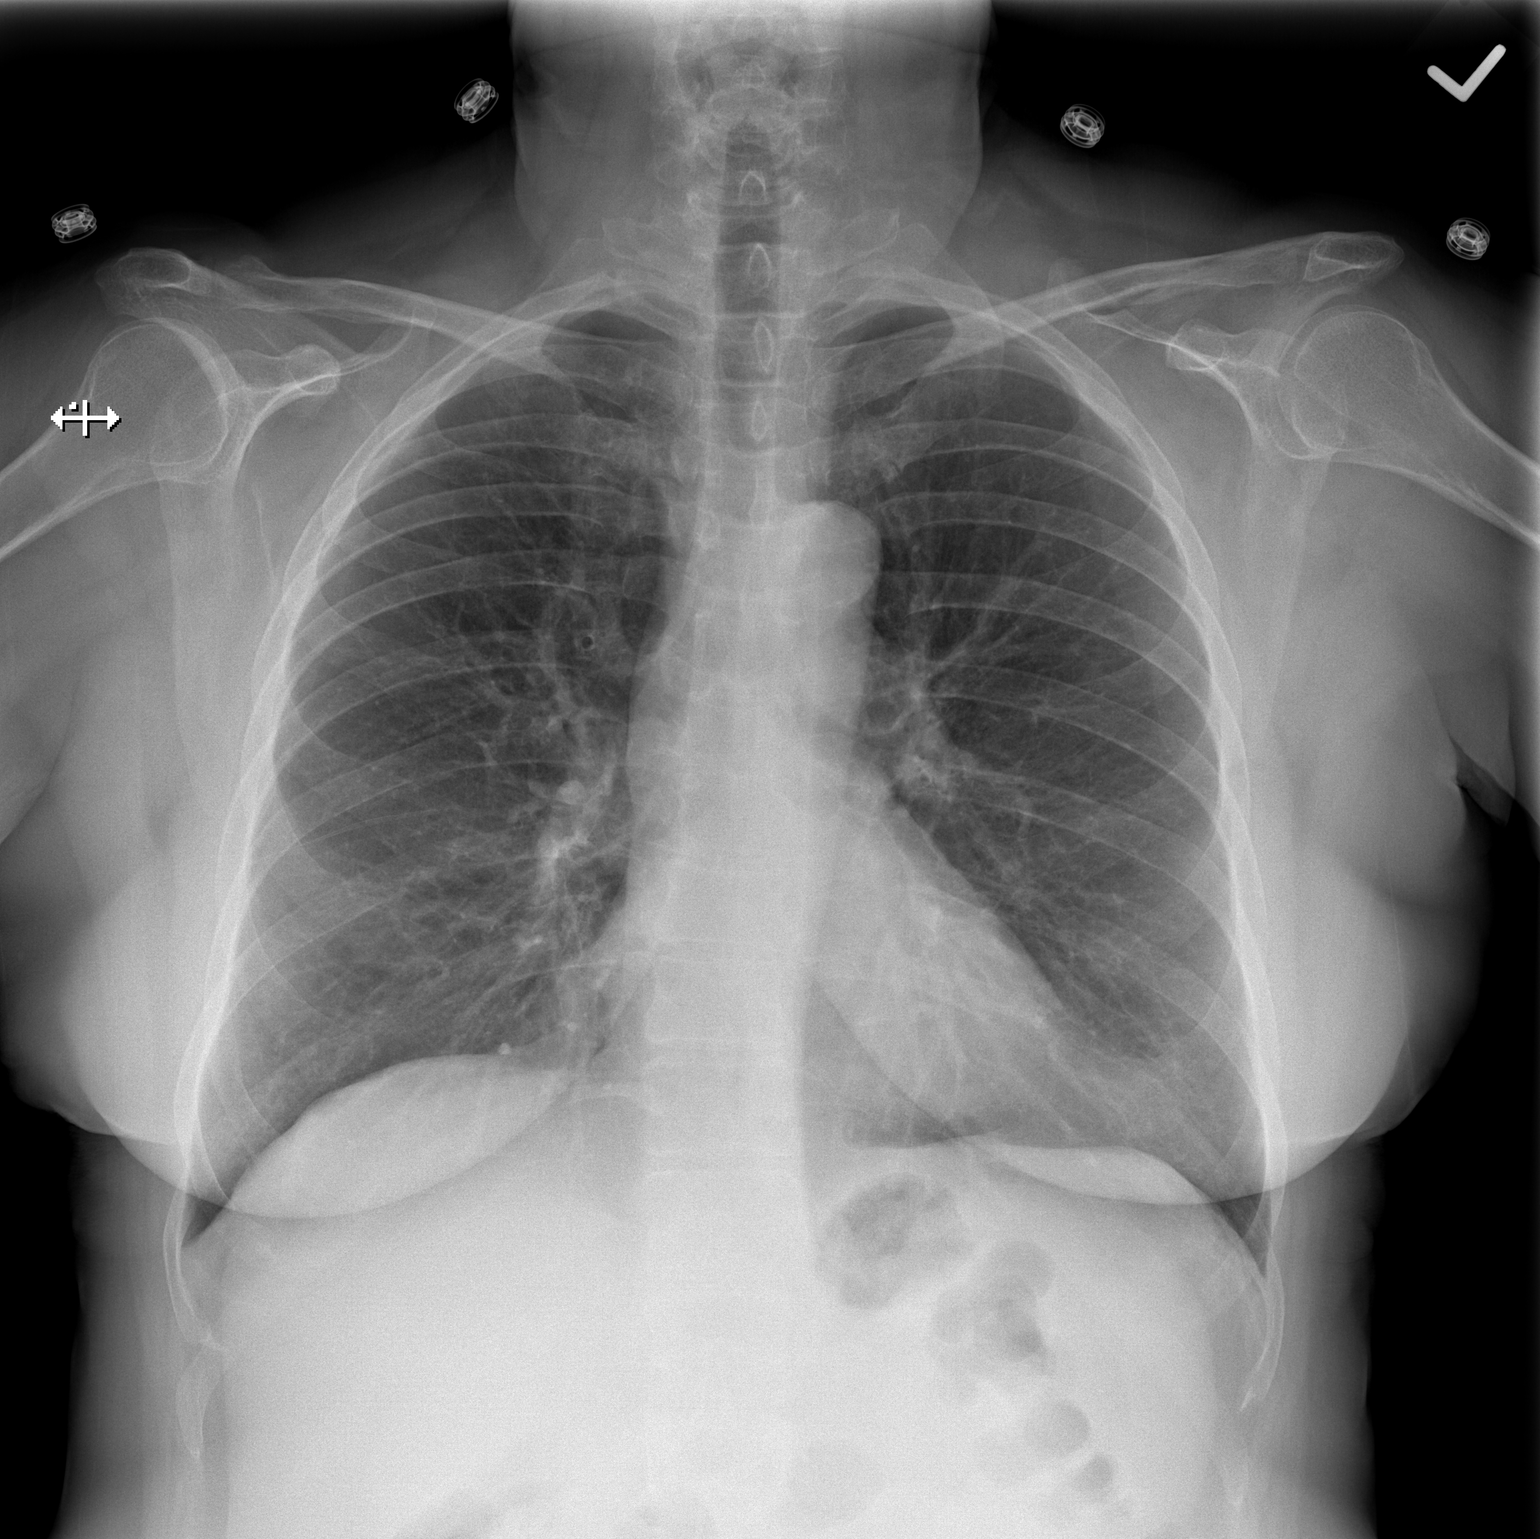

[w chest lat]
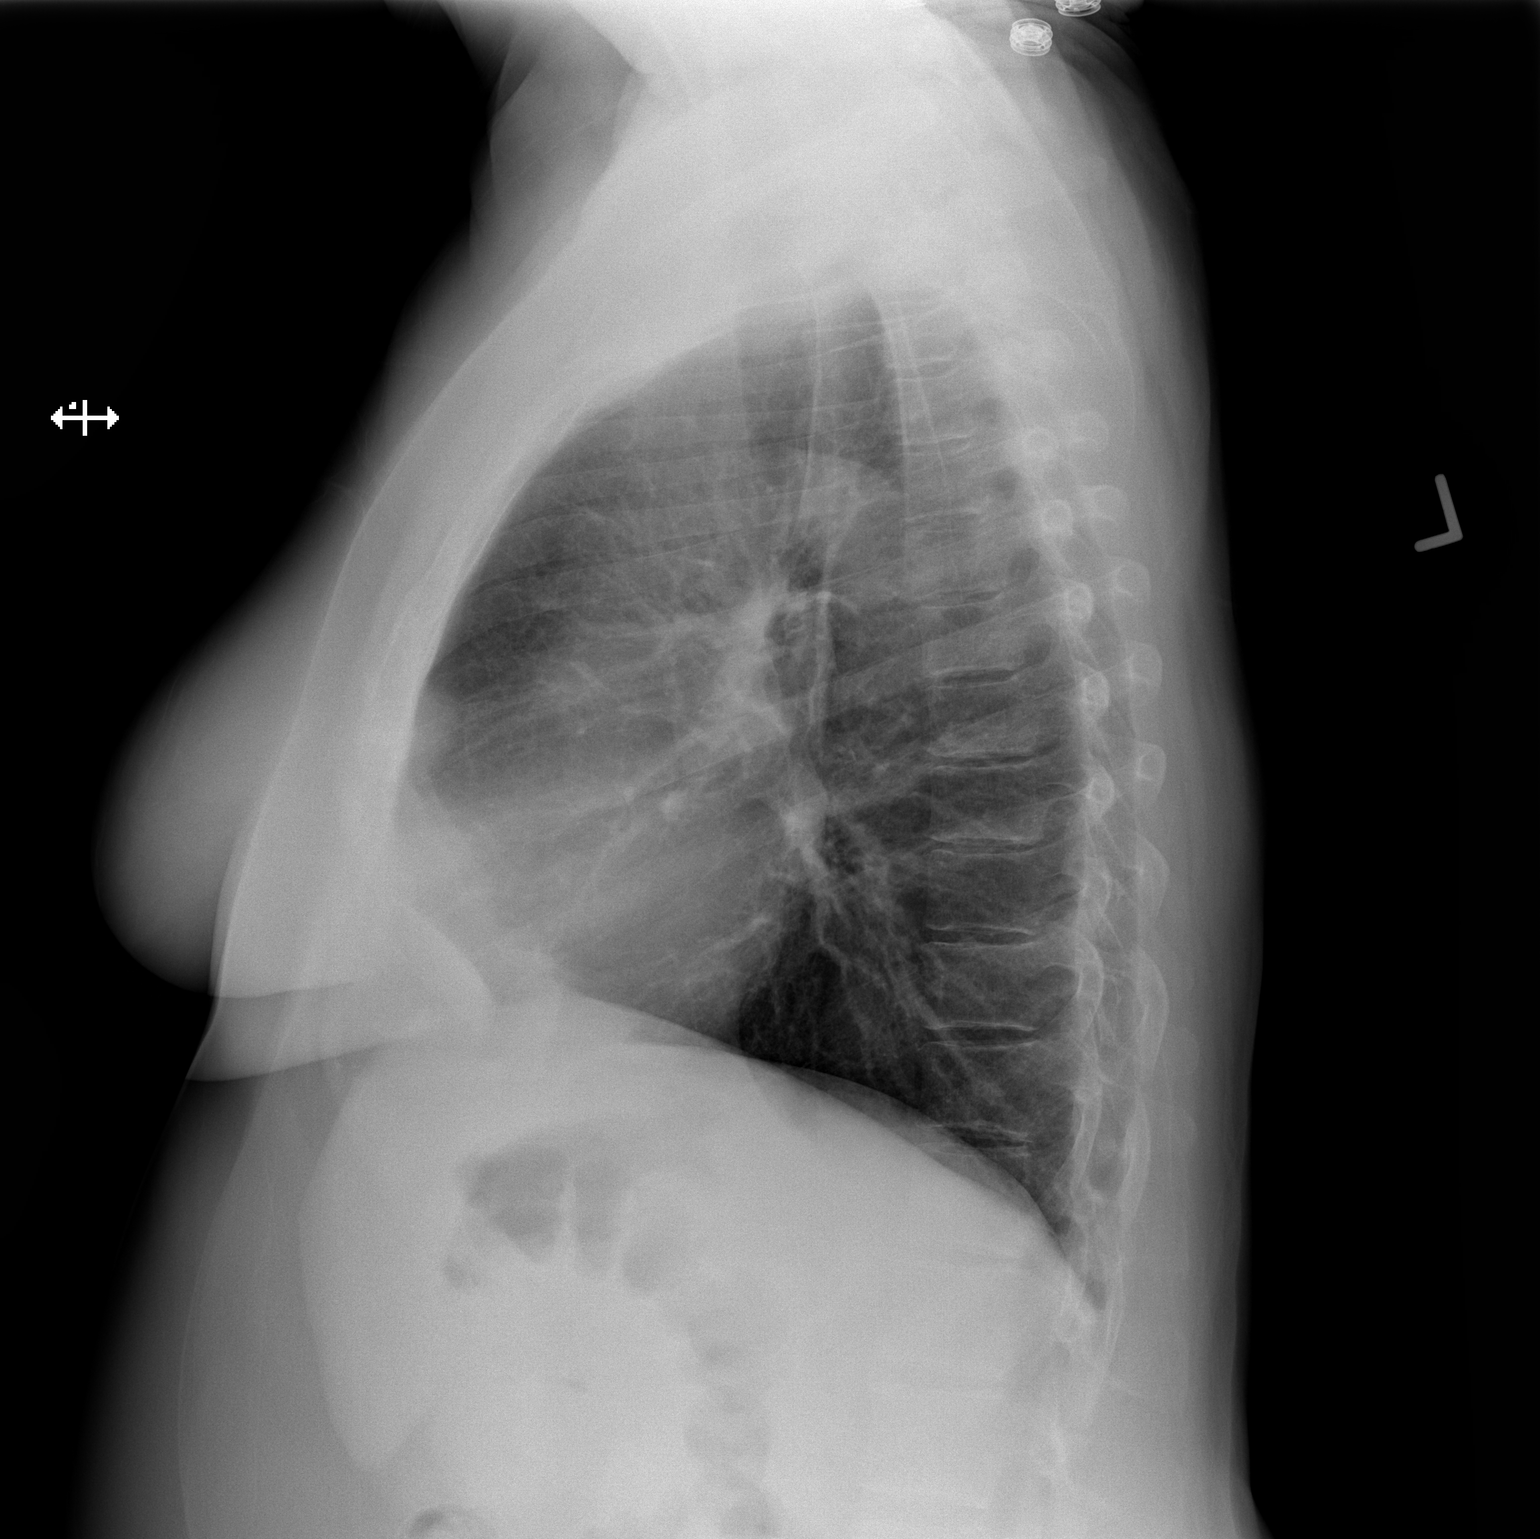

[2 of 2 positions shown; findings below may reference images not displayed]

FINDINGS: The heart size and mediastinal contours are within normal limits.
Both lungs are clear. The visualized skeletal structures are
unremarkable.
IMPRESSION: No active cardiopulmonary disease.
